# Patient Record
Sex: Male | Born: 1965 | Race: Black or African American | Hispanic: No | Marital: Single
Health system: Southern US, Community
[De-identification: ages and names within clinical notes are randomized; demographics above are authoritative.]

---

## 1997-11-03 ENCOUNTER — Emergency Department (HOSPITAL_COMMUNITY): Admission: EM | Admit: 1997-11-03 | Discharge: 1997-11-03 | Payer: Self-pay | Admitting: Emergency Medicine

## 1997-11-16 ENCOUNTER — Encounter: Admission: RE | Admit: 1997-11-16 | Discharge: 1997-11-16 | Payer: Self-pay | Admitting: Internal Medicine

## 1998-08-21 ENCOUNTER — Encounter: Payer: Self-pay | Admitting: Emergency Medicine

## 1998-08-21 ENCOUNTER — Inpatient Hospital Stay (HOSPITAL_COMMUNITY): Admission: EM | Admit: 1998-08-21 | Discharge: 1998-08-22 | Payer: Self-pay | Admitting: Emergency Medicine

## 1998-08-24 ENCOUNTER — Encounter: Admission: RE | Admit: 1998-08-24 | Discharge: 1998-08-24 | Payer: Self-pay | Admitting: Internal Medicine

## 1998-09-07 ENCOUNTER — Encounter: Admission: RE | Admit: 1998-09-07 | Discharge: 1998-09-07 | Payer: Self-pay | Admitting: Internal Medicine

## 1998-09-07 ENCOUNTER — Ambulatory Visit (HOSPITAL_COMMUNITY): Admission: RE | Admit: 1998-09-07 | Discharge: 1998-09-07 | Payer: Self-pay | Admitting: Internal Medicine

## 1998-09-16 ENCOUNTER — Encounter: Payer: Self-pay | Admitting: *Deleted

## 1998-09-16 ENCOUNTER — Emergency Department (HOSPITAL_COMMUNITY): Admission: EM | Admit: 1998-09-16 | Discharge: 1998-09-16 | Payer: Self-pay | Admitting: Emergency Medicine

## 1998-10-28 ENCOUNTER — Encounter: Admission: RE | Admit: 1998-10-28 | Discharge: 1998-10-28 | Payer: Self-pay | Admitting: Internal Medicine

## 1999-05-26 ENCOUNTER — Emergency Department (HOSPITAL_COMMUNITY): Admission: EM | Admit: 1999-05-26 | Discharge: 1999-05-26 | Payer: Self-pay | Admitting: Emergency Medicine

## 2000-10-23 ENCOUNTER — Encounter: Admission: RE | Admit: 2000-10-23 | Discharge: 2000-10-23 | Payer: Self-pay | Admitting: Internal Medicine

## 2000-10-23 ENCOUNTER — Ambulatory Visit (HOSPITAL_COMMUNITY): Admission: RE | Admit: 2000-10-23 | Discharge: 2000-10-23 | Payer: Self-pay | Admitting: Internal Medicine

## 2000-10-30 ENCOUNTER — Encounter: Admission: RE | Admit: 2000-10-30 | Discharge: 2000-10-30 | Payer: Self-pay | Admitting: Internal Medicine

## 2000-11-30 ENCOUNTER — Encounter: Admission: RE | Admit: 2000-11-30 | Discharge: 2000-11-30 | Payer: Self-pay | Admitting: Internal Medicine

## 2001-06-23 ENCOUNTER — Emergency Department (HOSPITAL_COMMUNITY): Admission: EM | Admit: 2001-06-23 | Discharge: 2001-06-23 | Payer: Self-pay | Admitting: Emergency Medicine

## 2001-11-19 ENCOUNTER — Emergency Department (HOSPITAL_COMMUNITY): Admission: EM | Admit: 2001-11-19 | Discharge: 2001-11-20 | Payer: Self-pay | Admitting: Emergency Medicine

## 2002-02-07 ENCOUNTER — Encounter: Admission: RE | Admit: 2002-02-07 | Discharge: 2002-02-07 | Payer: Self-pay | Admitting: Internal Medicine

## 2002-02-07 ENCOUNTER — Ambulatory Visit (HOSPITAL_COMMUNITY): Admission: RE | Admit: 2002-02-07 | Discharge: 2002-02-07 | Payer: Self-pay | Admitting: Internal Medicine

## 2002-05-15 ENCOUNTER — Encounter: Admission: RE | Admit: 2002-05-15 | Discharge: 2002-05-15 | Payer: Self-pay | Admitting: Internal Medicine

## 2002-06-25 ENCOUNTER — Encounter: Admission: RE | Admit: 2002-06-25 | Discharge: 2002-06-25 | Payer: Self-pay | Admitting: Internal Medicine

## 2003-07-04 ENCOUNTER — Emergency Department (HOSPITAL_COMMUNITY): Admission: EM | Admit: 2003-07-04 | Discharge: 2003-07-04 | Payer: Self-pay | Admitting: Emergency Medicine

## 2004-02-03 ENCOUNTER — Emergency Department (HOSPITAL_COMMUNITY): Admission: EM | Admit: 2004-02-03 | Discharge: 2004-02-03 | Payer: Self-pay | Admitting: Emergency Medicine

## 2004-04-28 ENCOUNTER — Ambulatory Visit (HOSPITAL_COMMUNITY): Admission: RE | Admit: 2004-04-28 | Discharge: 2004-04-28 | Payer: Self-pay | Admitting: Internal Medicine

## 2004-04-28 ENCOUNTER — Ambulatory Visit: Payer: Self-pay | Admitting: Internal Medicine

## 2004-06-06 ENCOUNTER — Ambulatory Visit: Payer: Self-pay | Admitting: Internal Medicine

## 2004-06-07 ENCOUNTER — Ambulatory Visit: Payer: Self-pay | Admitting: Internal Medicine

## 2004-08-21 ENCOUNTER — Emergency Department (HOSPITAL_COMMUNITY): Admission: EM | Admit: 2004-08-21 | Discharge: 2004-08-21 | Payer: Self-pay | Admitting: Emergency Medicine

## 2005-04-05 ENCOUNTER — Ambulatory Visit: Payer: Self-pay | Admitting: Internal Medicine

## 2005-04-26 ENCOUNTER — Emergency Department (HOSPITAL_COMMUNITY): Admission: EM | Admit: 2005-04-26 | Discharge: 2005-04-26 | Payer: Self-pay | Admitting: Emergency Medicine

## 2005-06-14 ENCOUNTER — Ambulatory Visit: Payer: Self-pay | Admitting: Internal Medicine

## 2005-08-23 ENCOUNTER — Ambulatory Visit: Payer: Self-pay | Admitting: Internal Medicine

## 2005-08-23 ENCOUNTER — Emergency Department (HOSPITAL_COMMUNITY): Admission: EM | Admit: 2005-08-23 | Discharge: 2005-08-23 | Payer: Self-pay | Admitting: Emergency Medicine

## 2005-12-30 ENCOUNTER — Emergency Department (HOSPITAL_COMMUNITY): Admission: EM | Admit: 2005-12-30 | Discharge: 2005-12-30 | Payer: Self-pay | Admitting: Emergency Medicine

## 2006-03-03 DIAGNOSIS — A539 Syphilis, unspecified: Secondary | ICD-10-CM

## 2006-03-03 DIAGNOSIS — F17200 Nicotine dependence, unspecified, uncomplicated: Secondary | ICD-10-CM

## 2006-03-03 DIAGNOSIS — B20 Human immunodeficiency virus [HIV] disease: Secondary | ICD-10-CM

## 2006-05-14 DIAGNOSIS — J309 Allergic rhinitis, unspecified: Secondary | ICD-10-CM | POA: Insufficient documentation

## 2007-04-29 ENCOUNTER — Encounter (INDEPENDENT_AMBULATORY_CARE_PROVIDER_SITE_OTHER): Payer: Self-pay | Admitting: *Deleted

## 2007-04-29 ENCOUNTER — Ambulatory Visit: Payer: Self-pay | Admitting: Internal Medicine

## 2007-04-29 ENCOUNTER — Encounter: Admission: RE | Admit: 2007-04-29 | Discharge: 2007-04-29 | Payer: Self-pay | Admitting: Internal Medicine

## 2007-04-29 ENCOUNTER — Ambulatory Visit (HOSPITAL_COMMUNITY): Admission: RE | Admit: 2007-04-29 | Discharge: 2007-04-29 | Payer: Self-pay | Admitting: Internal Medicine

## 2007-04-29 LAB — CONVERTED CEMR LAB
Bilirubin Urine: NEGATIVE
Blood in Urine, dipstick: NEGATIVE
Ketones, urine, test strip: NEGATIVE
Specific Gravity, Urine: 1.01
pH: 7.5

## 2007-04-30 ENCOUNTER — Encounter (INDEPENDENT_AMBULATORY_CARE_PROVIDER_SITE_OTHER): Payer: Self-pay | Admitting: *Deleted

## 2007-04-30 LAB — CONVERTED CEMR LAB
AST: 29 units/L (ref 0–37)
Albumin: 4.2 g/dL (ref 3.5–5.2)
BUN: 15 mg/dL (ref 6–23)
Barbiturate Quant, Ur: NEGATIVE
Basophils Relative: 1 % (ref 0–1)
CO2: 27 meq/L (ref 19–32)
Calcium: 9.2 mg/dL (ref 8.4–10.5)
Chloride: 103 meq/L (ref 96–112)
Cholesterol: 121 mg/dL (ref 0–200)
Creatinine,U: 128.3 mg/dL
HCV Ab: NEGATIVE
HDL: 27 mg/dL — ABNORMAL LOW (ref >39)
Hemoglobin: 14.8 g/dL (ref 13.0–17.0)
Hep B C IgM: NEGATIVE
Hepatitis B Surface Ag: NEGATIVE
Lymphocytes Relative: 27 % (ref 12–46)
Lymphs Abs: 1.5 10*3/uL (ref 0.7–4.0)
MCHC: 31.3 g/dL (ref 30.0–36.0)
Monocytes Absolute: 0.6 10*3/uL (ref 0.1–1.0)
Monocytes Relative: 10 % (ref 3–12)
Neutro Abs: 3.5 10*3/uL (ref 1.7–7.7)
Neutrophils Relative %: 62 % (ref 43–77)
Opiates: NEGATIVE
Phencyclidine (PCP): NEGATIVE
Potassium: 4.6 meq/L (ref 3.5–5.3)
Propoxyphene: NEGATIVE
RBC: 5.22 M/uL (ref 4.22–5.81)
RPR Ser Ql: REACTIVE — AB
RPR Titer: 1:128 {titer} — AB
WBC: 5.7 10*3/uL (ref 4.0–10.5)

## 2007-05-02 ENCOUNTER — Inpatient Hospital Stay (HOSPITAL_COMMUNITY): Admission: AD | Admit: 2007-05-02 | Discharge: 2007-05-04 | Payer: Self-pay | Admitting: Hospitalist

## 2007-05-02 ENCOUNTER — Ambulatory Visit: Payer: Self-pay | Admitting: Internal Medicine

## 2007-05-02 ENCOUNTER — Encounter: Admission: RE | Admit: 2007-05-02 | Discharge: 2007-05-02 | Payer: Self-pay | Admitting: Infectious Disease

## 2007-05-02 ENCOUNTER — Ambulatory Visit: Payer: Self-pay | Admitting: Hospitalist

## 2007-05-02 DIAGNOSIS — R519 Headache, unspecified: Secondary | ICD-10-CM | POA: Insufficient documentation

## 2007-05-02 DIAGNOSIS — R51 Headache: Secondary | ICD-10-CM | POA: Insufficient documentation

## 2007-05-02 DIAGNOSIS — R05 Cough: Secondary | ICD-10-CM

## 2007-05-13 ENCOUNTER — Encounter (INDEPENDENT_AMBULATORY_CARE_PROVIDER_SITE_OTHER): Payer: Self-pay | Admitting: Hospitalist

## 2007-05-14 ENCOUNTER — Ambulatory Visit: Payer: Self-pay | Admitting: Hospitalist

## 2007-05-14 DIAGNOSIS — J019 Acute sinusitis, unspecified: Secondary | ICD-10-CM | POA: Insufficient documentation

## 2007-06-14 ENCOUNTER — Ambulatory Visit: Payer: Self-pay | Admitting: Internal Medicine

## 2007-06-14 ENCOUNTER — Encounter (INDEPENDENT_AMBULATORY_CARE_PROVIDER_SITE_OTHER): Payer: Self-pay | Admitting: *Deleted

## 2007-07-17 ENCOUNTER — Ambulatory Visit: Payer: Self-pay | Admitting: Internal Medicine

## 2007-07-18 ENCOUNTER — Encounter (INDEPENDENT_AMBULATORY_CARE_PROVIDER_SITE_OTHER): Payer: Self-pay | Admitting: *Deleted

## 2007-10-10 ENCOUNTER — Telehealth (INDEPENDENT_AMBULATORY_CARE_PROVIDER_SITE_OTHER): Payer: Self-pay | Admitting: *Deleted

## 2007-10-14 ENCOUNTER — Telehealth (INDEPENDENT_AMBULATORY_CARE_PROVIDER_SITE_OTHER): Payer: Self-pay | Admitting: *Deleted

## 2007-11-05 ENCOUNTER — Telehealth (INDEPENDENT_AMBULATORY_CARE_PROVIDER_SITE_OTHER): Payer: Self-pay | Admitting: *Deleted

## 2007-12-13 ENCOUNTER — Encounter: Payer: Self-pay | Admitting: Internal Medicine

## 2007-12-23 ENCOUNTER — Telehealth: Payer: Self-pay | Admitting: Internal Medicine

## 2008-01-02 ENCOUNTER — Ambulatory Visit: Payer: Self-pay | Admitting: Internal Medicine

## 2008-01-02 ENCOUNTER — Encounter (INDEPENDENT_AMBULATORY_CARE_PROVIDER_SITE_OTHER): Payer: Self-pay | Admitting: *Deleted

## 2008-01-27 ENCOUNTER — Ambulatory Visit: Payer: Self-pay | Admitting: Internal Medicine

## 2008-01-27 ENCOUNTER — Encounter: Payer: Self-pay | Admitting: Internal Medicine

## 2008-01-27 DIAGNOSIS — J069 Acute upper respiratory infection, unspecified: Secondary | ICD-10-CM | POA: Insufficient documentation

## 2008-01-27 LAB — CONVERTED CEMR LAB
AST: 31 units/L (ref 0–37)
Alkaline Phosphatase: 103 units/L (ref 39–117)
BUN: 12 mg/dL (ref 6–23)
Basophils Relative: 0 % (ref 0–1)
Calcium: 9.3 mg/dL (ref 8.4–10.5)
Creatinine, Ser: 0.78 mg/dL (ref 0.40–1.50)
Eosinophils Absolute: 0.1 10*3/uL (ref 0.0–0.7)
Eosinophils Relative: 1 % (ref 0–5)
HDL: 36 mg/dL — ABNORMAL LOW (ref >39)
Hemoglobin: 15.6 g/dL (ref 13.0–17.0)
Hepatitis B Surface Ag: NEGATIVE
Ketones, ur: NEGATIVE mg/dL
Leukocytes, UA: NEGATIVE
MCHC: 32 g/dL (ref 30.0–36.0)
MCV: 87.4 fL (ref 78.0–100.0)
Monocytes Absolute: 0.6 10*3/uL (ref 0.1–1.0)
Monocytes Relative: 9 % (ref 3–12)
Nitrite: NEGATIVE
RBC: 5.57 M/uL (ref 4.22–5.81)
RPR Titer: 1:2 {titer}
Specific Gravity, Urine: 1.023 (ref 1.005–1.03)
Total CHOL/HDL Ratio: 3.6
pH: 6 (ref 5.0–8.0)

## 2008-02-05 ENCOUNTER — Telehealth (INDEPENDENT_AMBULATORY_CARE_PROVIDER_SITE_OTHER): Payer: Self-pay | Admitting: *Deleted

## 2008-02-12 ENCOUNTER — Ambulatory Visit: Payer: Self-pay | Admitting: Internal Medicine

## 2008-02-26 ENCOUNTER — Encounter (INDEPENDENT_AMBULATORY_CARE_PROVIDER_SITE_OTHER): Payer: Self-pay | Admitting: *Deleted

## 2008-02-26 ENCOUNTER — Ambulatory Visit: Payer: Self-pay | Admitting: Internal Medicine

## 2008-02-26 DIAGNOSIS — H00019 Hordeolum externum unspecified eye, unspecified eyelid: Secondary | ICD-10-CM | POA: Insufficient documentation

## 2008-02-26 LAB — CONVERTED CEMR LAB
HIV 1 RNA Quant: 76 copies/mL — ABNORMAL HIGH (ref <48)
HIV-1 RNA Quant, Log: 1.88 — ABNORMAL HIGH (ref <1.68)

## 2008-02-27 ENCOUNTER — Telehealth: Payer: Self-pay | Admitting: Internal Medicine

## 2008-02-28 ENCOUNTER — Telehealth (INDEPENDENT_AMBULATORY_CARE_PROVIDER_SITE_OTHER): Payer: Self-pay | Admitting: *Deleted

## 2008-03-23 ENCOUNTER — Telehealth: Payer: Self-pay | Admitting: Internal Medicine

## 2008-03-27 ENCOUNTER — Telehealth (INDEPENDENT_AMBULATORY_CARE_PROVIDER_SITE_OTHER): Payer: Self-pay | Admitting: *Deleted

## 2008-04-03 ENCOUNTER — Telehealth (INDEPENDENT_AMBULATORY_CARE_PROVIDER_SITE_OTHER): Payer: Self-pay | Admitting: *Deleted

## 2008-04-14 ENCOUNTER — Telehealth: Payer: Self-pay | Admitting: Licensed Clinical Social Worker

## 2008-04-21 ENCOUNTER — Telehealth: Payer: Self-pay | Admitting: Internal Medicine

## 2008-04-27 ENCOUNTER — Emergency Department (HOSPITAL_COMMUNITY): Admission: EM | Admit: 2008-04-27 | Discharge: 2008-04-27 | Payer: Self-pay | Admitting: Emergency Medicine

## 2008-04-29 ENCOUNTER — Telehealth: Payer: Self-pay | Admitting: Internal Medicine

## 2008-05-06 ENCOUNTER — Telehealth (INDEPENDENT_AMBULATORY_CARE_PROVIDER_SITE_OTHER): Payer: Self-pay | Admitting: *Deleted

## 2008-05-19 ENCOUNTER — Encounter (INDEPENDENT_AMBULATORY_CARE_PROVIDER_SITE_OTHER): Payer: Self-pay | Admitting: Licensed Clinical Social Worker

## 2008-05-20 ENCOUNTER — Encounter (INDEPENDENT_AMBULATORY_CARE_PROVIDER_SITE_OTHER): Payer: Self-pay | Admitting: *Deleted

## 2008-06-02 ENCOUNTER — Telehealth (INDEPENDENT_AMBULATORY_CARE_PROVIDER_SITE_OTHER): Payer: Self-pay | Admitting: *Deleted

## 2008-06-15 ENCOUNTER — Ambulatory Visit: Payer: Self-pay | Admitting: Infectious Disease

## 2008-06-15 ENCOUNTER — Ambulatory Visit: Payer: Self-pay | Admitting: Internal Medicine

## 2008-06-15 ENCOUNTER — Inpatient Hospital Stay (HOSPITAL_COMMUNITY): Admission: AD | Admit: 2008-06-15 | Discharge: 2008-06-17 | Payer: Self-pay | Admitting: Infectious Disease

## 2008-06-15 DIAGNOSIS — R1110 Vomiting, unspecified: Secondary | ICD-10-CM

## 2008-06-16 LAB — CONVERTED CEMR LAB
CD4 Count: 580 microliters
WBC: 6.2 10*3/uL

## 2008-06-19 ENCOUNTER — Encounter: Payer: Self-pay | Admitting: Internal Medicine

## 2008-06-19 DIAGNOSIS — J189 Pneumonia, unspecified organism: Secondary | ICD-10-CM

## 2008-07-01 ENCOUNTER — Telehealth (INDEPENDENT_AMBULATORY_CARE_PROVIDER_SITE_OTHER): Payer: Self-pay | Admitting: *Deleted

## 2008-07-02 ENCOUNTER — Ambulatory Visit: Payer: Self-pay | Admitting: Internal Medicine

## 2008-07-15 ENCOUNTER — Ambulatory Visit: Payer: Self-pay | Admitting: Internal Medicine

## 2008-07-30 ENCOUNTER — Telehealth (INDEPENDENT_AMBULATORY_CARE_PROVIDER_SITE_OTHER): Payer: Self-pay | Admitting: *Deleted

## 2008-08-26 ENCOUNTER — Telehealth (INDEPENDENT_AMBULATORY_CARE_PROVIDER_SITE_OTHER): Payer: Self-pay | Admitting: *Deleted

## 2008-09-28 ENCOUNTER — Telehealth (INDEPENDENT_AMBULATORY_CARE_PROVIDER_SITE_OTHER): Payer: Self-pay | Admitting: *Deleted

## 2008-10-14 ENCOUNTER — Ambulatory Visit: Payer: Self-pay | Admitting: Internal Medicine

## 2008-10-14 LAB — CONVERTED CEMR LAB
Albumin: 4.2 g/dL (ref 3.5–5.2)
BUN: 19 mg/dL (ref 6–23)
Basophils Absolute: 0 10*3/uL (ref 0.0–0.1)
CO2: 23 meq/L (ref 19–32)
Calcium: 9 mg/dL (ref 8.4–10.5)
Chloride: 107 meq/L (ref 96–112)
GFR calc Af Amer: >60 mL/min (ref >60)
GFR calc non Af Amer: >60 mL/min (ref >60)
Glucose, Bld: 91 mg/dL (ref 70–99)
HIV-1 RNA Quant, Log: <1.68 (ref <1.68)
Lymphocytes Relative: 20 % (ref 12–46)
Lymphs Abs: 1.6 10*3/uL (ref 0.7–4.0)
Neutrophils Relative %: 71 % (ref 43–77)
Platelets: 206 10*3/uL (ref 150–400)
Potassium: 4.2 meq/L (ref 3.5–5.3)
RDW: 14 % (ref 11.5–15.5)
WBC: 8.2 10*3/uL (ref 4.0–10.5)

## 2008-10-22 ENCOUNTER — Telehealth (INDEPENDENT_AMBULATORY_CARE_PROVIDER_SITE_OTHER): Payer: Self-pay | Admitting: *Deleted

## 2008-11-17 ENCOUNTER — Telehealth (INDEPENDENT_AMBULATORY_CARE_PROVIDER_SITE_OTHER): Payer: Self-pay | Admitting: *Deleted

## 2008-12-15 ENCOUNTER — Telehealth (INDEPENDENT_AMBULATORY_CARE_PROVIDER_SITE_OTHER): Payer: Self-pay | Admitting: *Deleted

## 2009-01-13 ENCOUNTER — Ambulatory Visit: Payer: Self-pay | Admitting: Internal Medicine

## 2009-01-13 ENCOUNTER — Telehealth (INDEPENDENT_AMBULATORY_CARE_PROVIDER_SITE_OTHER): Payer: Self-pay | Admitting: *Deleted

## 2009-01-13 DIAGNOSIS — K029 Dental caries, unspecified: Secondary | ICD-10-CM | POA: Insufficient documentation

## 2009-01-13 LAB — CONVERTED CEMR LAB
ALT: 27 units/L (ref 0–53)
AST: 25 units/L (ref 0–37)
Alkaline Phosphatase: 119 units/L — ABNORMAL HIGH (ref 39–117)
BUN: 20 mg/dL (ref 6–23)
Basophils Absolute: 0 10*3/uL (ref 0.0–0.1)
Calcium: 9.2 mg/dL (ref 8.4–10.5)
Chloride: 102 meq/L (ref 96–112)
Creatinine, Ser: 0.97 mg/dL (ref 0.40–1.50)
Eosinophils Absolute: 0.1 10*3/uL (ref 0.0–0.7)
Eosinophils Relative: 1 % (ref 0–5)
Lymphs Abs: 1.8 10*3/uL (ref 0.7–4.0)
MCV: 93.8 fL (ref >=78.0)
Neutrophils Relative %: 76 % (ref 43–77)
Platelets: 214 10*3/uL (ref 150–400)
RDW: 14.3 % (ref 11.5–15.5)
Total Bilirubin: 0.4 mg/dL (ref 0.3–1.2)
WBC: 10.7 10*3/uL — ABNORMAL HIGH (ref 4.0–10.5)

## 2009-02-10 ENCOUNTER — Telehealth (INDEPENDENT_AMBULATORY_CARE_PROVIDER_SITE_OTHER): Payer: Self-pay | Admitting: *Deleted

## 2009-03-10 ENCOUNTER — Observation Stay (HOSPITAL_COMMUNITY): Admission: EM | Admit: 2009-03-10 | Discharge: 2009-03-13 | Payer: Self-pay | Admitting: Emergency Medicine

## 2009-03-10 ENCOUNTER — Telehealth: Payer: Self-pay | Admitting: Internal Medicine

## 2009-03-10 ENCOUNTER — Ambulatory Visit: Payer: Self-pay | Admitting: Internal Medicine

## 2009-03-10 ENCOUNTER — Encounter (INDEPENDENT_AMBULATORY_CARE_PROVIDER_SITE_OTHER): Payer: Self-pay | Admitting: Internal Medicine

## 2009-03-12 ENCOUNTER — Telehealth (INDEPENDENT_AMBULATORY_CARE_PROVIDER_SITE_OTHER): Payer: Self-pay | Admitting: *Deleted

## 2009-04-06 ENCOUNTER — Telehealth (INDEPENDENT_AMBULATORY_CARE_PROVIDER_SITE_OTHER): Payer: Self-pay | Admitting: *Deleted

## 2009-04-27 ENCOUNTER — Telehealth (INDEPENDENT_AMBULATORY_CARE_PROVIDER_SITE_OTHER): Payer: Self-pay | Admitting: *Deleted

## 2009-04-28 ENCOUNTER — Ambulatory Visit: Payer: Self-pay | Admitting: Internal Medicine

## 2009-04-28 DIAGNOSIS — J209 Acute bronchitis, unspecified: Secondary | ICD-10-CM

## 2009-06-05 ENCOUNTER — Telehealth (INDEPENDENT_AMBULATORY_CARE_PROVIDER_SITE_OTHER): Payer: Self-pay | Admitting: *Deleted

## 2009-06-28 ENCOUNTER — Telehealth (INDEPENDENT_AMBULATORY_CARE_PROVIDER_SITE_OTHER): Payer: Self-pay | Admitting: *Deleted

## 2009-07-09 ENCOUNTER — Encounter (INDEPENDENT_AMBULATORY_CARE_PROVIDER_SITE_OTHER): Payer: Self-pay | Admitting: *Deleted

## 2009-08-02 ENCOUNTER — Encounter (INDEPENDENT_AMBULATORY_CARE_PROVIDER_SITE_OTHER): Payer: Self-pay | Admitting: *Deleted

## 2009-08-04 ENCOUNTER — Telehealth (INDEPENDENT_AMBULATORY_CARE_PROVIDER_SITE_OTHER): Payer: Self-pay | Admitting: *Deleted

## 2009-08-27 ENCOUNTER — Telehealth (INDEPENDENT_AMBULATORY_CARE_PROVIDER_SITE_OTHER): Payer: Self-pay | Admitting: *Deleted

## 2009-10-08 ENCOUNTER — Telehealth: Payer: Self-pay | Admitting: Internal Medicine

## 2009-11-19 ENCOUNTER — Telehealth: Payer: Self-pay | Admitting: Internal Medicine

## 2009-11-22 ENCOUNTER — Encounter: Payer: Self-pay | Admitting: Internal Medicine

## 2009-12-13 ENCOUNTER — Ambulatory Visit: Payer: Self-pay | Admitting: Internal Medicine

## 2009-12-13 ENCOUNTER — Other Ambulatory Visit: Payer: Self-pay | Admitting: Infectious Disease

## 2009-12-13 LAB — CONVERTED CEMR LAB
AST: 17 units/L (ref 0–37)
Albumin: 4.3 g/dL (ref 3.5–5.2)
Alkaline Phosphatase: 108 units/L (ref 39–117)
BUN: 16 mg/dL (ref 6–23)
Basophils Relative: 1 % (ref 0–1)
Creatinine, Ser: 0.86 mg/dL (ref 0.40–1.50)
Eosinophils Absolute: 0.1 10*3/uL (ref 0.0–0.7)
Eosinophils Relative: 2 % (ref 0–5)
Glucose, Bld: 158 mg/dL — ABNORMAL HIGH (ref 70–99)
HCT: 46.3 % (ref 39.0–52.0)
HIV 1 RNA Quant: <48 copies/mL (ref <48)
Hemoglobin: 14.8 g/dL (ref 13.0–17.0)
Lymphs Abs: 1.8 10*3/uL (ref 0.7–4.0)
MCHC: 32 g/dL (ref 30.0–36.0)
MCV: 93.9 fL (ref 78.0–100.0)
Monocytes Absolute: 0.5 10*3/uL (ref 0.1–1.0)
Monocytes Relative: 8 % (ref 3–12)
Potassium: 4.4 meq/L (ref 3.5–5.3)
RBC: 4.93 M/uL (ref 4.22–5.81)
RPR Titer: 1:1 {titer}
T pallidum Antibodies (TP-PA): >8 — ABNORMAL HIGH (ref <0.90)
WBC: 5.4 10*3/uL (ref 4.0–10.5)

## 2009-12-21 ENCOUNTER — Telehealth: Payer: Self-pay | Admitting: Internal Medicine

## 2010-01-17 ENCOUNTER — Telehealth: Payer: Self-pay | Admitting: Internal Medicine

## 2010-01-30 IMAGING — US US ABDOMEN COMPLETE
1 series · 13 of 25 positions shown · non-contrast
Comparison: CT examination 06/16/2008.

CLINICAL DATA: History of abdominal pain and history of fever.

ABDOMINAL ULTRASOUND COMPLETE

[Series 1: us abdomen complete · 0.30mm/px · 13 of 64 slices shown]
[im 1/64]
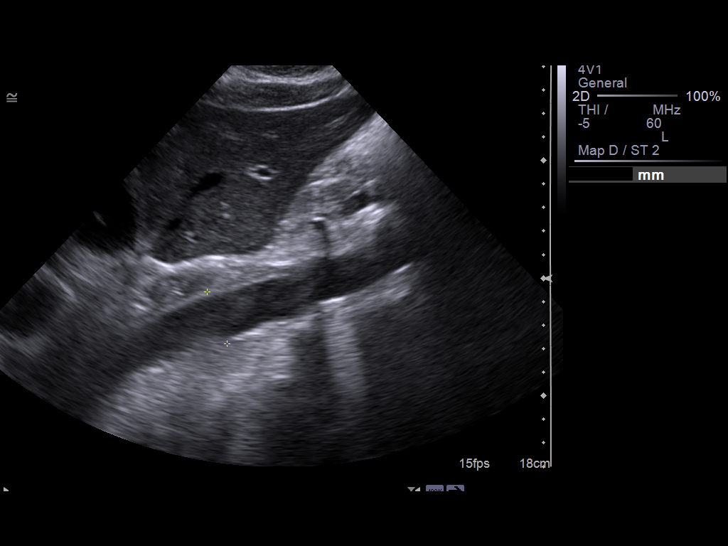
[im 6/64]
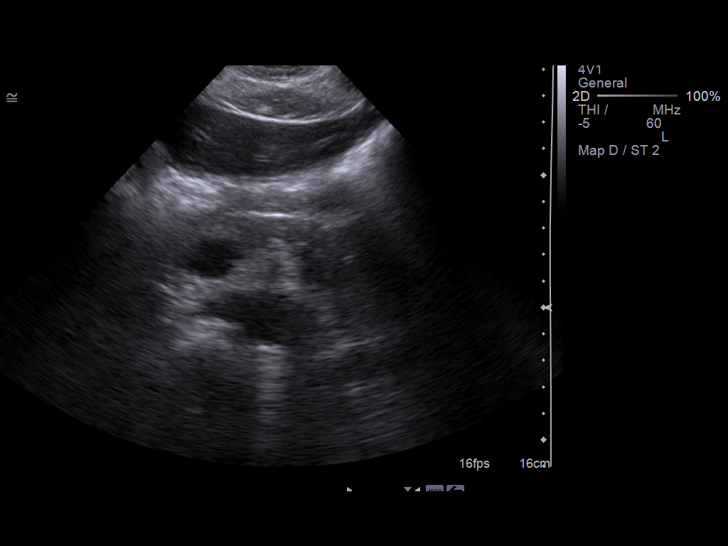
[im 11/64]
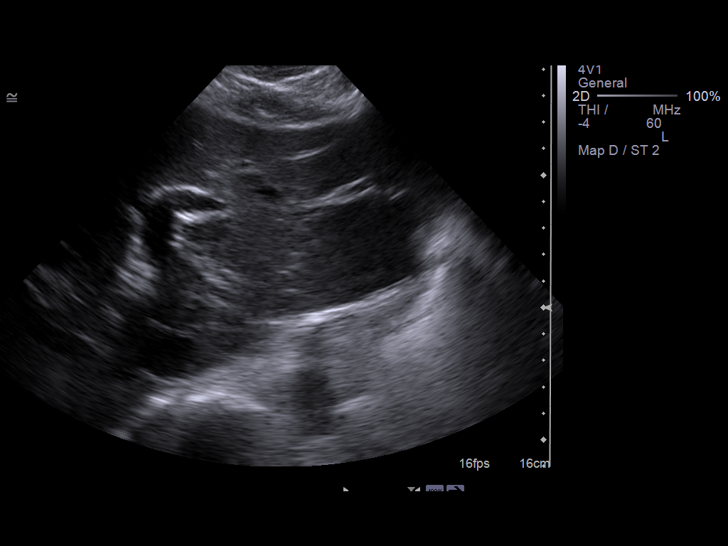
[im 16/64]
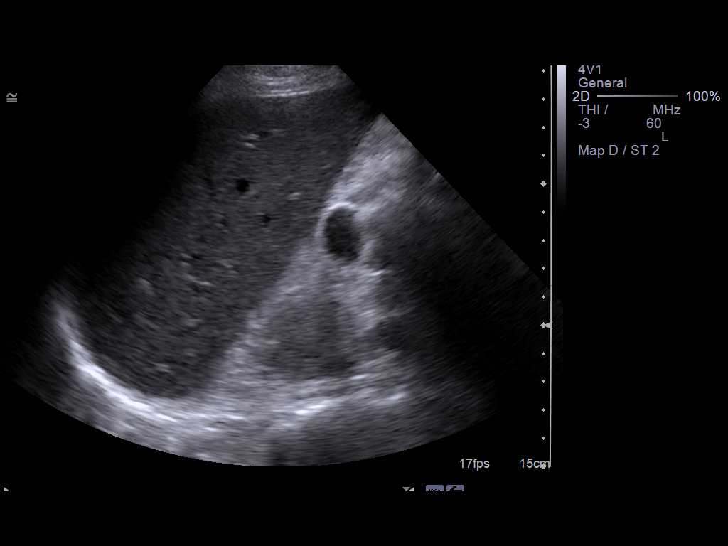
[im 22/64]
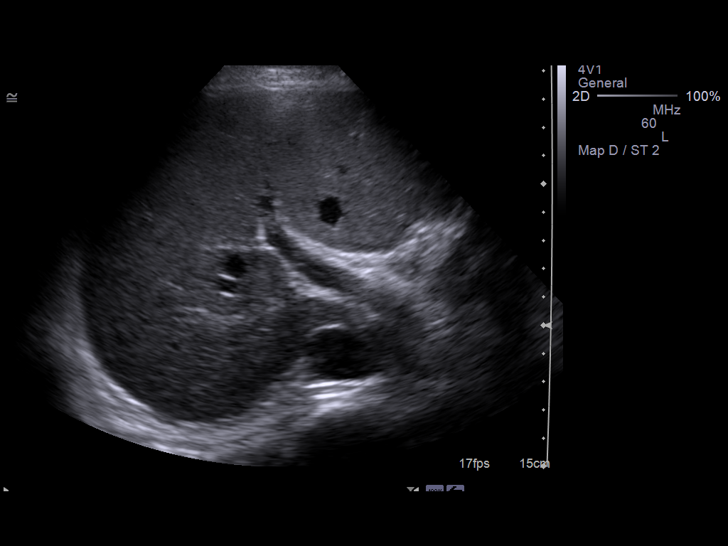
[im 27/64]
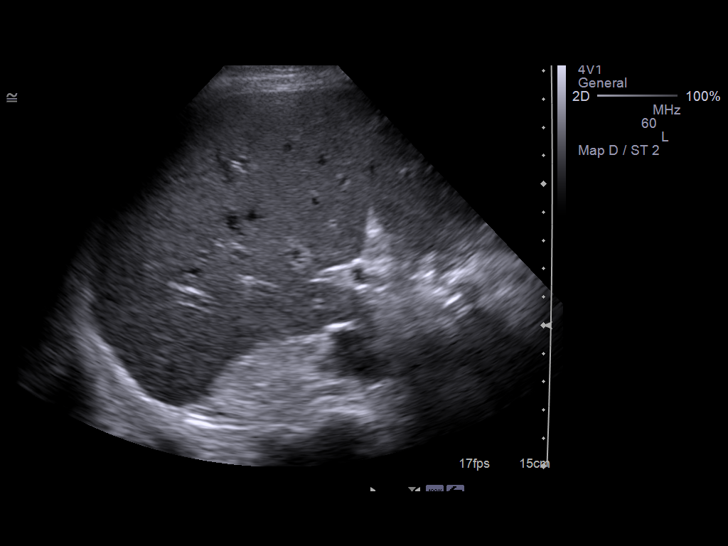
[im 32/64]
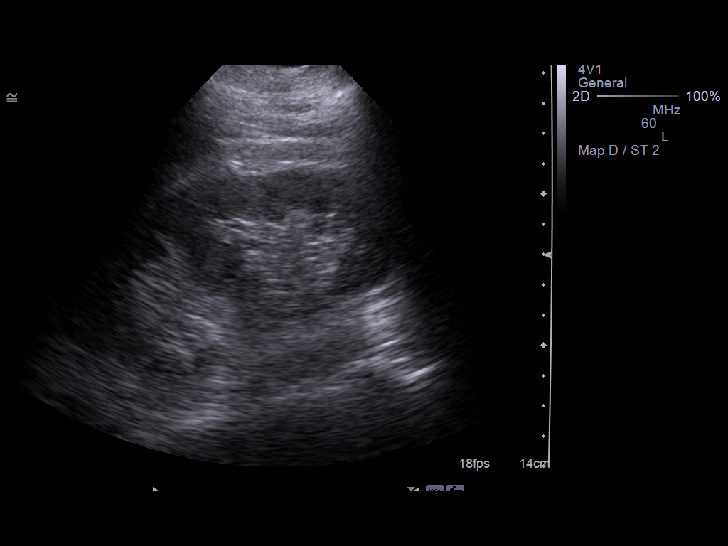
[im 37/64]
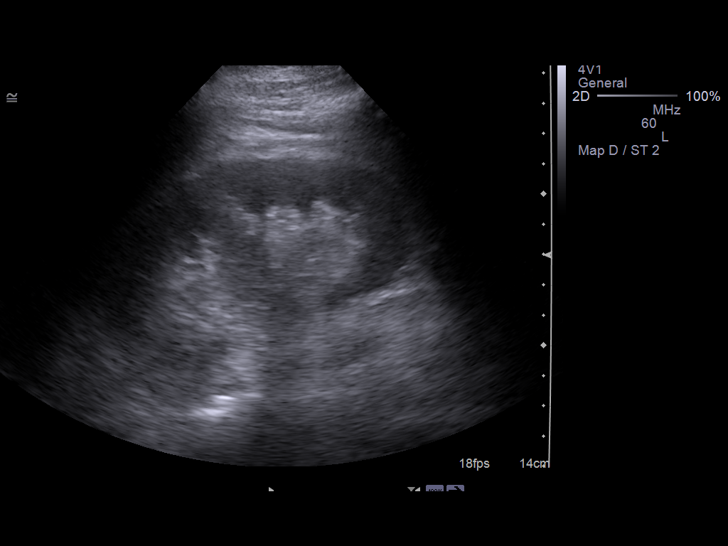
[im 43/64]
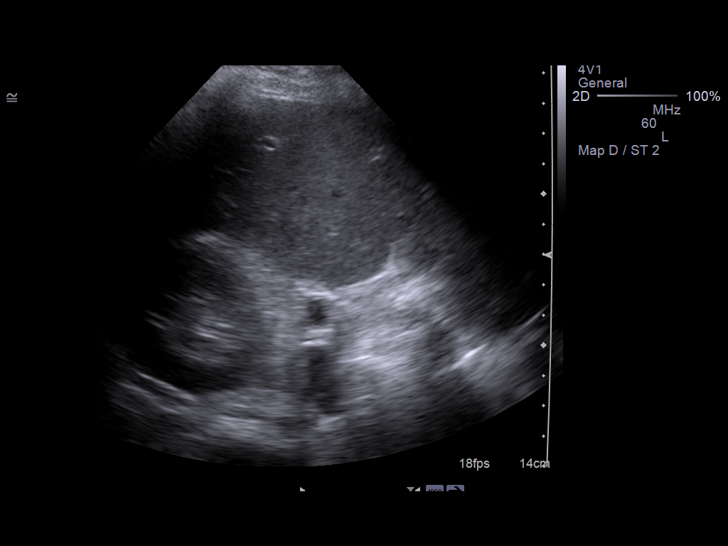
[im 48/64]
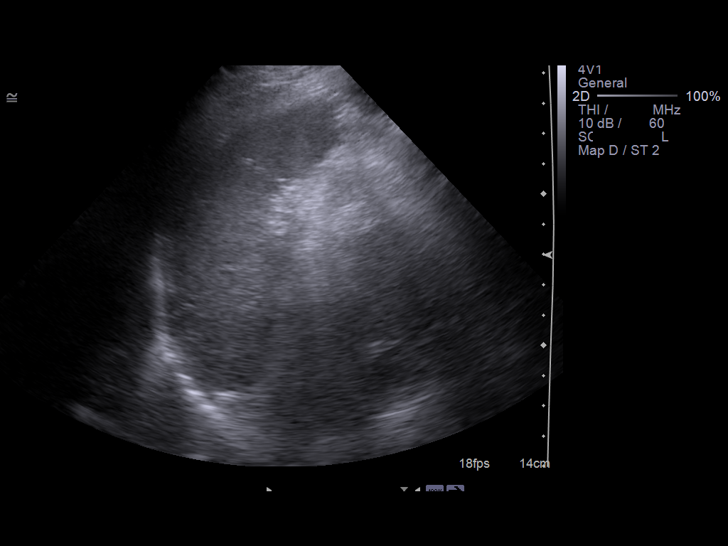
[im 53/64]
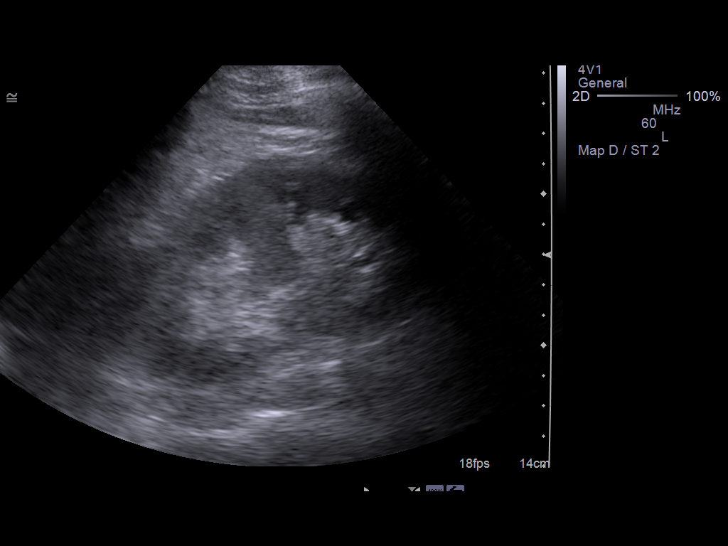
[im 58/64]
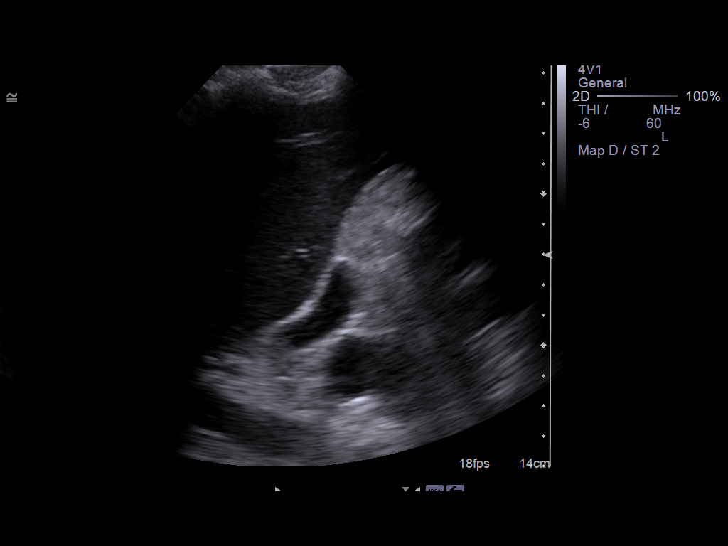
[im 64/64]
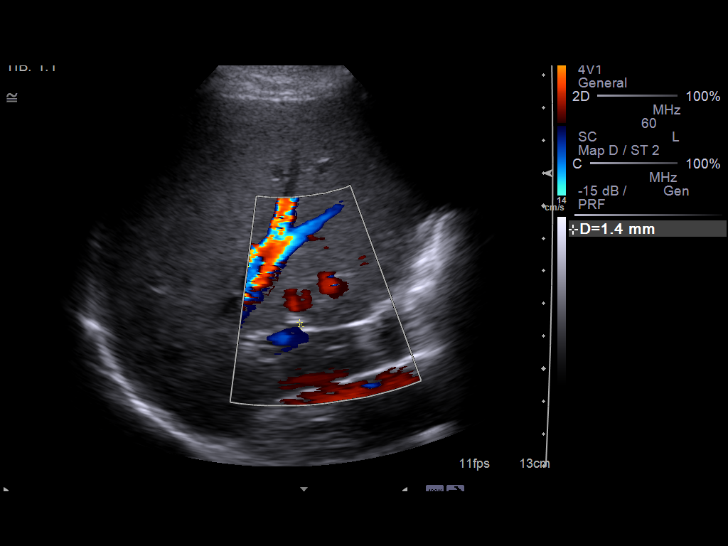

[13 of 25 positions shown; findings below may reference images not displayed]

FINDINGS: Gallbladder: No shadowing gallstones or echogenic sludge. No
gallbladder wall thickening or pericholecystic fluid. The
gallbladder wall thickness measured 2.9 mm. No sonographic Murphy's
sign according to the ultrasound technologist. The gall appears to
be small and contracted.  After question patient that gave
technologist information that the patient had not been n.p.o.

CBD: Normal in caliber measuring 1.4 mm. No choledocholithiasis is
evident.

Liver:  Normal size and echotexture without focal parenchymal
abnormality.

IVC:  Patent throughout its visualized course in the abdomen.

Pancreas:  Although the pancreas is difficult to visualize in its
entirety, no focal pancreatic abnormality is identified.

Spleen:  Normal size and echotexture without focal abnormality.
Length is 7.6 cm.

Right kidney:  No hydronephrosis.  Well-preserved cortex.  Normal
parenchymal echotexture without focal abnormalities.  Right renal
length is 11.0 cm.

Left kidney:  No hydronephrosis.  Well-preserved cortex.  Normal
parenchymal echotexture without focal abnormalities.  Left renal
length is 11.6 cm.

Aorta:  Maximum diameter is 2.4 cm.  No aneurysm is evident.

Ascites:  None.
IMPRESSION: No abdominal pathology was demonstrated.  The patient gave the
technologist information that the patient had not been fasting.
Gallbladder is small and partially contracted.  However no
cholelithiasis is evident.

## 2010-01-31 IMAGING — CR DG CHEST 1V PORT
1 series · 1 of 1 positions shown · non-contrast
Comparison: 03/10/2009

CLINICAL DATA: Follow up pneumonia

PORTABLE CHEST - 1 VIEW

[view not recorded]
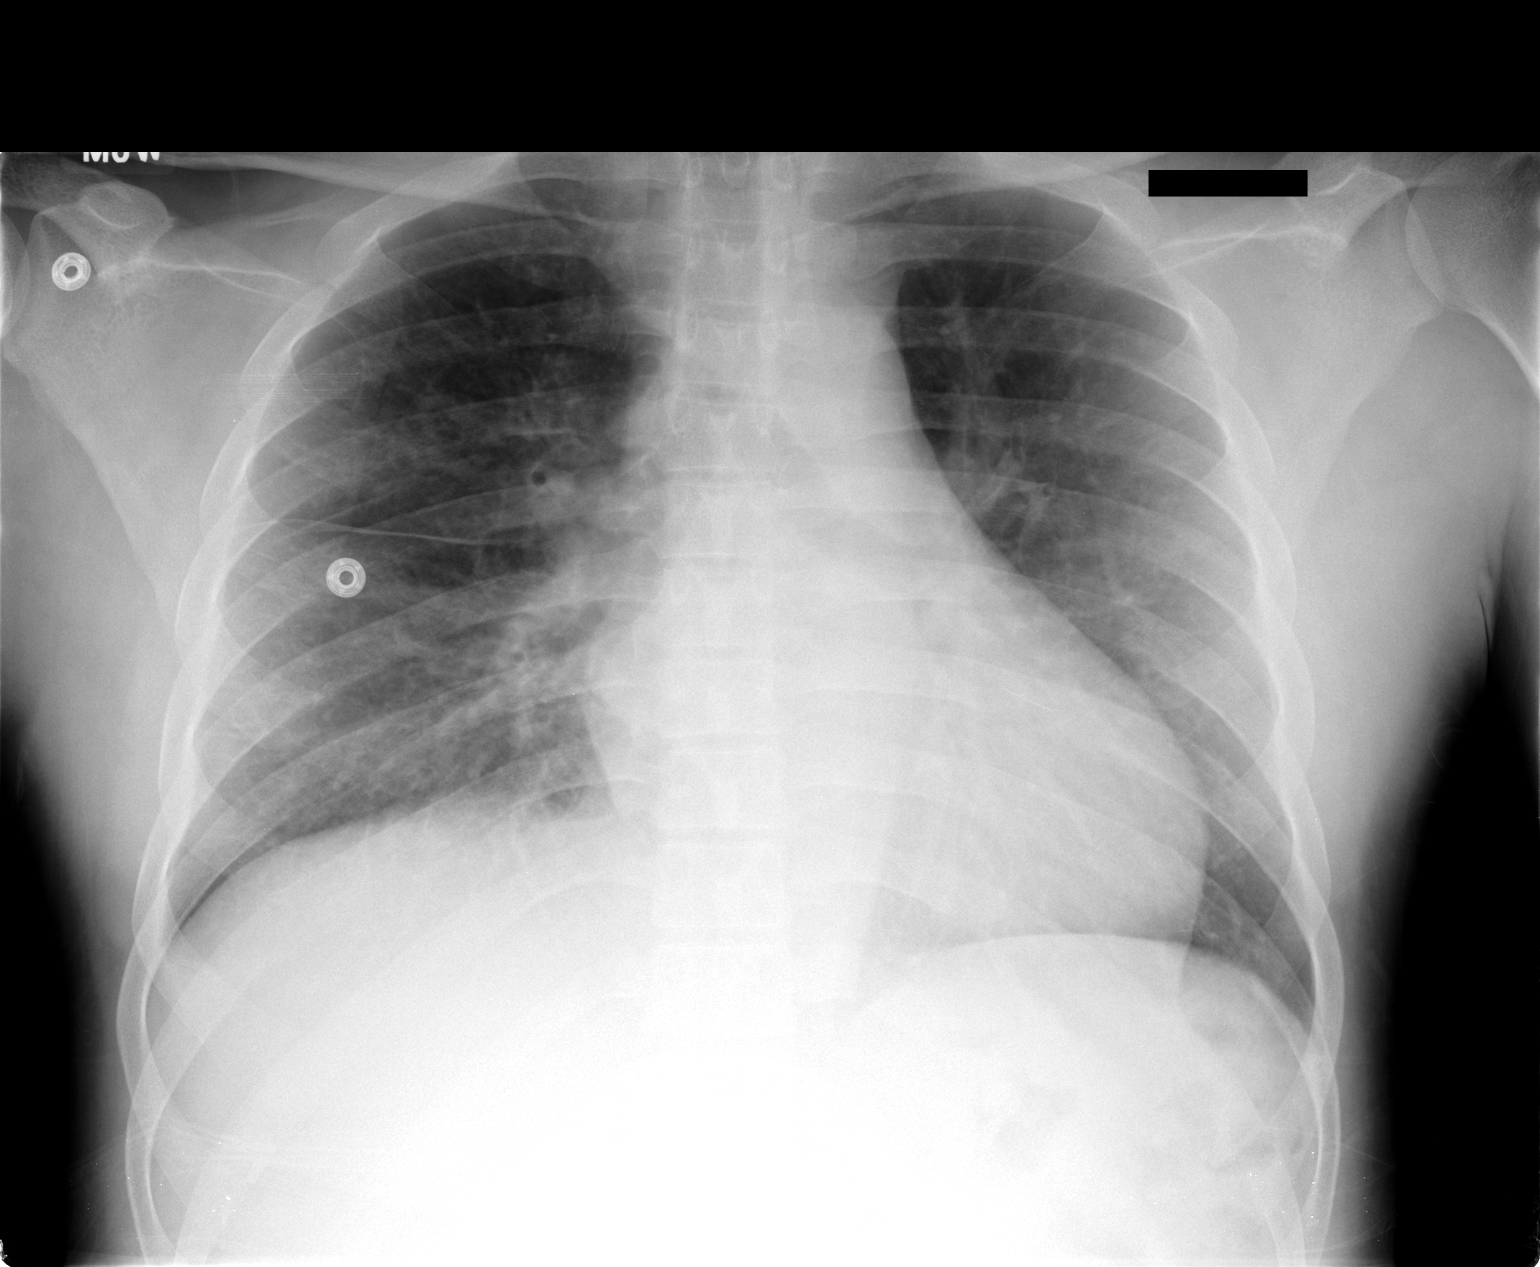

[1 of 1 positions shown; findings below may reference images not displayed]

FINDINGS: Peribronchial thickening may be due to bronchitic changes
or interstitial edema. No focal infiltrate/atelectasis.
Cardiomediastinal silhouette unremarkable.
IMPRESSION: Peribronchial thickening.  Query bronchitis versus mild
interstitial edema.

## 2010-02-15 ENCOUNTER — Telehealth (INDEPENDENT_AMBULATORY_CARE_PROVIDER_SITE_OTHER): Payer: Self-pay | Admitting: *Deleted

## 2010-04-19 ENCOUNTER — Encounter: Payer: Self-pay | Admitting: Internal Medicine

## 2010-05-06 ENCOUNTER — Ambulatory Visit: Admit: 2010-05-06 | Payer: Self-pay | Admitting: Internal Medicine

## 2010-05-18 ENCOUNTER — Ambulatory Visit: Admit: 2010-05-18 | Payer: Self-pay | Admitting: Internal Medicine

## 2010-05-18 ENCOUNTER — Ambulatory Visit
Admission: RE | Admit: 2010-05-18 | Discharge: 2010-05-18 | Payer: Self-pay | Source: Home / Self Care | Attending: Infectious Diseases | Admitting: Infectious Diseases

## 2010-05-18 ENCOUNTER — Encounter: Payer: Self-pay | Admitting: Infectious Diseases

## 2010-05-18 LAB — CONVERTED CEMR LAB
Albumin: 4.5 g/dL (ref 3.5–5.2)
BUN: 11 mg/dL (ref 6–23)
Basophils Absolute: 0 10*3/uL (ref 0.0–0.1)
Bilirubin Urine: NEGATIVE
Blood, UA: NEGATIVE
Calcium: 9.1 mg/dL (ref 8.4–10.5)
Chlamydia, Swab/Urine, PCR: NEGATIVE
Chloride: 106 meq/L (ref 96–112)
Creatinine, Ser: 1.07 mg/dL (ref 0.40–1.50)
GC Probe Amp, Urine: NEGATIVE
Glucose, Bld: 143 mg/dL — ABNORMAL HIGH (ref 70–99)
HCT: 48.3 % (ref 39.0–52.0)
HDL: 47 mg/dL (ref >39)
HIV 1 RNA Quant: <20 copies/mL (ref <20)
Hemoglobin: 15.6 g/dL (ref 13.0–17.0)
Hepatitis B Surface Ag: NEGATIVE
Ketones, ur: NEGATIVE mg/dL
LDL Cholesterol: 91 mg/dL (ref 0–99)
Lymphocytes Relative: 29 % (ref 12–46)
Lymphs Abs: 2.1 10*3/uL (ref 0.7–4.0)
Monocytes Absolute: 0.6 10*3/uL (ref 0.1–1.0)
Monocytes Relative: 9 % (ref 3–12)
Neutro Abs: 4.2 10*3/uL (ref 1.7–7.7)
Potassium: 4.3 meq/L (ref 3.5–5.3)
RBC: 5.23 M/uL (ref 4.22–5.81)
Specific Gravity, Urine: 1.02 (ref 1.005–1.030)
Total CHOL/HDL Ratio: 3.3
Urobilinogen, UA: 0.2 (ref 0.0–1.0)
VLDL: 17 mg/dL (ref 0–40)
WBC: 7 10*3/uL (ref 4.0–10.5)
pH: 5.5 (ref 5.0–8.0)

## 2010-05-19 LAB — T-HELPER CELL (CD4) - (RCID CLINIC ONLY)
CD4 % Helper T Cell: 27 % — ABNORMAL LOW (ref 33–55)
CD4 T Cell Abs: 590 uL (ref 400–2700)

## 2010-05-23 ENCOUNTER — Encounter: Payer: Self-pay | Admitting: Adult Health

## 2010-05-26 NOTE — Miscellaneous (Signed)
Summary: Orders Update  Clinical Lists Changes  Orders: Added new Test order of T-CBC w/Diff [phone]) - Signed Added new Test order of T-CD4SP [facility]) (CD4SP) - Signed Added new Test order of T-Comprehensive Metabolic Panel [phone]) - Signed Added new Test order of T-HIV Viral Load [phone]) - Signed     Process Orders Check Orders Results:     [facility]: ABN not required for this insurance Tests Sent for requisitioning (November 22, 2009 9:25 AM):     11/22/2009: [facility] -- T-CBC w/Diff [[id number]] (signed)     11/22/2009: [facility] -- T-Comprehensive Metabolic Panel [[id number]] (signed)     11/22/2009: [facility] -- T-HIV Viral Load [phone] (signed)

## 2010-05-26 NOTE — Progress Notes (Signed)
Summary: Patient picked up medication  Phone Note Other Incoming   Caller: Patient Summary of Call: Patient came in to pick up meds.

## 2010-05-26 NOTE — Progress Notes (Signed)
Summary: [facility]/pt assist med arrived for Feb  Phone Note Refill Request      Prescriptions: ATRIPLA 600-200-300 MG  TABS (EFAVIRENZ-EMTRICITAB-TENOFOVIR) Take 1 tablet by mouth at bedtime  #30 x 0   Entered by:   Paulo Fruit  BS,CPht II,MPH   Authorized by:   Yisroel Ramming MD   Signed by:   Paulo Fruit  BS,CPht II,MPH on 06/05/2009   Method used:   Samples Given   RxID:   [id number]   Patient Assist Medication Verification: Medication: Atripla Lot# [id number] Exp Date:06 2012 Tech approval:MLD Will call pt on Monday 06/07/09 Paulo Fruit  BS,CPht II,MPH  June 05, 2009 10:59 AM

## 2010-05-26 NOTE — Miscellaneous (Signed)
Summary: clinical update/ryan white [employer] apprv til 07/23/10  Clinical Lists Changes  Observations: Added new observation of AIDSDAP: Yes 2011 (08/02/2009 11:41)
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
## 2010-05-26 NOTE — Miscellaneous (Signed)
Summary: Orders Update - RPR orders added  Clinical Lists Changes  Orders: Added new Test order of T-RPR (Syphilis) [phone]) - Signed     Process Orders Check Orders Results:     [facility]: ABN not required for this insurance Order queued for requisitioning for [facility]: November 22, 2009 2:27 PM  Tests Sent for requisitioning (November 22, 2009 2:27 PM):     11/22/2009: [facility] -- T-RPR (Syphilis) [phone] (signed)

## 2010-05-26 NOTE — Progress Notes (Signed)
Summary: ncadap med arrived for Aug-- msg left on vm for pt  Phone Note Refill Request      Prescriptions: ATRIPLA 600-200-300 MG  TABS (EFAVIRENZ-EMTRICITAB-TENOFOVIR) Take 1 tablet by mouth at bedtime  #30 x 0   Entered by:   Paulo Fruit  BS,CPht II,MPH   Authorized by:   Yisroel Ramming MD   Signed by:   Paulo Fruit  BS,CPht II,MPH on 12/21/2009   Method used:   Samples Given   RxID:   [id number]  Patient Assist Medication Verification: Medication name: Atripla RX # [id number] Tech approval:MLD Call placed to patient with message that assistance medications are ready for pick-up. Left messate for patient Paulo Fruit  BS,CPht II,MPH  December 21, 2009 11:30 AM

## 2010-05-26 NOTE — Progress Notes (Signed)
Summary: Prod. cough, green mucous, no fever x 3 days  Phone Note Call from Patient Call back at [facility] Phone [phone]   Caller: Patient Call For: Yisroel Ramming MD Reason for Call: Acute Illness Action Taken: Phone Call Completed, Appt Scheduled Details of Action Taken: 04/28/09 @ 1545 w/ Dr. Philipp Deputy Summary of Call: Prod. cough, green mucous x 3 days.  No fever.  Requesting appt.  Jennet Maduro RN  April 27, 2009 10:35 AM

## 2010-05-26 NOTE — Progress Notes (Signed)
Summary: [facility]/pt assist med arrived for Jun via [facility]  Phone Note Refill Request      Prescriptions: ATRIPLA 600-200-300 MG  TABS (EFAVIRENZ-EMTRICITAB-TENOFOVIR) Take 1 tablet by mouth at bedtime  #30 x 0   Entered by:   Paulo Fruit  BS,CPht II,MPH   Authorized by:   Yisroel Ramming MD   Signed by:   Paulo Fruit  BS,CPht II,MPH on 10/08/2009   Method used:   Samples Given   RxID:   [id number]  Patient Assist Medication Verification: Medication name: Atripla RX #  [id number] Tech approval:MLD Call placed to patient with message that assistance medications are ready for pick-up. patient said he will pick up next week. Paulo Fruit  BS,CPht II,MPH  October 08, 2009 11:02 AM

## 2010-05-26 NOTE — Progress Notes (Signed)
Summary: [facility]/pt assist med arrived for Mar  Phone Note Refill Request      Prescriptions: ATRIPLA 600-200-300 MG  TABS (EFAVIRENZ-EMTRICITAB-TENOFOVIR) Take 1 tablet by mouth at bedtime  #30 x 0   Entered by:   Paulo Fruit  BS,CPht II,MPH   Authorized by:   Yisroel Ramming MD   Signed by:   Paulo Fruit  BS,CPht II,MPH on 06/28/2009   Method used:   Samples Given   RxID:   [id number]   Patient Assist Medication Verification: Medication: Atripla Lot# [id number] Exp Date:07 2013 Tech approval:MLD Call placed to patient with message that assistance medications are ready for pick-up. Left message Paulo Fruit  BS,CPht II,MPH  June 28, 2009 2:51 PM

## 2010-05-26 NOTE — Progress Notes (Signed)
Summary: NCADAP/pt assist med arrived for Apr  Phone Note Refill Request      Prescriptions: ATRIPLA 600-200-300 MG  TABS (EFAVIRENZ-EMTRICITAB-TENOFOVIR) Take 1 tablet by mouth at bedtime  #30 x 0   Entered by:   Paulo Fruit  BS,CPht II,MPH   Authorized by:   Yisroel Ramming MD   Signed by:   Paulo Fruit  BS,CPht II,MPH on 08/04/2009   Method used:   Samples Given   RxID:   [id number]   Patient Assist Medication Verification: Medication: Atripla Lot# [id number] Exp Date:07 2013 Tech approval:MLD tried to contact patient. Was unable to leave message. Number on file not in service Paulo Fruit  BS,CPht II,MPH  August 04, 2009 3:35 PM                  Appended Document: NCADAP/pt assist med arrived for Apr Prescription/Samples picked up by: patient April's medications on  Aug 25, 2009

## 2010-05-26 NOTE — Progress Notes (Signed)
Summary: [employer]ADSAP/pt assist med arrived for May  Phone Note Refill Request      Prescriptions: ATRIPLA 600-200-300 MG  TABS (EFAVIRENZ-EMTRICITAB-TENOFOVIR) Take 1 tablet by mouth at bedtime  #30 x 0   Entered by:   Paulo Fruit  BS,CPht II,MPH   Authorized by:   Yisroel Ramming MD   Signed by:   Paulo Fruit  BS,CPht II,MPH on 08/27/2009   Method used:   Samples Given   RxID:   [id number]   Patient Assist Medication Verification: Medication: Atripla Lot# [id number] Exp Date:08 2013 Tech approval:MLD **Patient just picked up April's medications on Aug 25, 2009 Paulo Fruit  BS,CPht II,MPH  Aug 27, 2009 9:51 AM

## 2010-05-26 NOTE — Miscellaneous (Signed)
Summary: clinical update/ryan white NC[employer]AP app completed  Clinical Lists Changes  Observations: Added new observation of YEARLYEXPEN: 0  (07/09/2009 14:46) Added new observation of INCOMESOURCE: WAGES  (07/09/2009 14:46) Added new observation of PCTFPL: 131.47  (07/09/2009 14:46) Added new observation of HOUSEINCOME: [address]  (07/09/2009 14:46) Added new observation of FINASSESSDT: 07/08/2009  (07/09/2009 14:46)

## 2010-05-26 NOTE — Progress Notes (Signed)
Summary: NCADAP/pt assist med arrived for Jul  Phone Note Refill Request      Prescriptions: ATRIPLA 600-200-300 MG  TABS (EFAVIRENZ-EMTRICITAB-TENOFOVIR) Take 1 tablet by mouth at bedtime  #30 x 0   Entered by:   Paulo Fruit  BS,CPht II,MPH   Authorized by:   Yisroel Ramming MD   Signed by:   Paulo Fruit  BS,CPht II,MPH on 11/19/2009   Method used:   Samples Given   RxID:   [id number]  Patient Assist Medication Verification: Medication name: Atripla RX #  [id number] Tech approval:MLD Call placed to patient with message that assistance medications are ready for pick-up. Paulo Fruit  BS,CPht II,MPH  November 19, 2009 10:45 AM  Appended Document: NC[employer]/pt assist med arrived for Jul pt needs labs and f/u appt  Appended Document: [employer]/pt assist med arrived for Jul Lab and office visit scheduled

## 2010-05-26 NOTE — Progress Notes (Signed)
Summary: ncadap meds arrived for sept-pt aware  Phone Note Refill Request      Prescriptions: ATRIPLA 600-200-300 MG  TABS (EFAVIRENZ-EMTRICITAB-TENOFOVIR) Take 1 tablet by mouth at bedtime  #30 x 0   Entered by:   Paulo Fruit  BS,CPht II,MPH   Authorized by:   Yisroel Ramming MD   Signed by:   Paulo Fruit  BS,CPht II,MPH on 01/17/2010   Method used:   Samples Given   RxID:   [id number]  Patient Assist Medication Verification: Medication name: Neva Seat # [id number] Tech approval:mld Call placed to patient with message that assistance medications are ready for pick-up. Paulo Fruit  BS,CPht II,MPH  January 17, 2010 12:46 PM

## 2010-05-26 NOTE — Letter (Signed)
Summary: [facility]  [address]   [place], [place] [address]   Phone: [phone]  Fax: [phone]          April 19, 2010  Benjamin Buckley [address] [place], [place]  [address]  Dear [name],  It is also time for you to return to the Center at the above address to see your doctor for follow-up care and lab work.  Please call the number above and select option 2 to make your next appointments.  We have attempted to contact you over the past two months to come to pick up your medication(s)here at the Center.  We were unable to leave you messages to this effect.  Your medication is at the Center at the address listed above in the letterhead.  Please come in as soon as possible to pick up your medications and have your lab work drawn prior to your doctor appointment.  Wishing you a safe and healthy holiday season!  Sincerely,    Jennet Maduro [facility]

## 2010-06-14 ENCOUNTER — Encounter (INDEPENDENT_AMBULATORY_CARE_PROVIDER_SITE_OTHER): Payer: Self-pay | Admitting: *Deleted

## 2010-06-15 NOTE — Letter (Signed)
Summary: Imported By: Florinda Marker 06/06/2010 18:35:10   Imported By: Florinda Marker 06/06/2010 18:35:10  _____________________________________________________________________  External Attachment:    Type:   Image     Comment:   External Document

## 2010-06-21 NOTE — Miscellaneous (Signed)
  Clinical Lists Changes  Observations: Added new observation of HIV STATUS: HIV positive - not AIDS (06/14/2010 16:15)

## 2010-07-05 NOTE — Miscellaneous (Addendum)
Summary: Orders Update  Clinical Lists Changes  Orders: Added new Test order of T-Chlamydia  Probe, urine [phone]) - Signed Added new Test order of T-CBC w/Diff [phone]) - Signed Added new Test order of T-CD4SP [facility]) (CD4SP) - Signed Added new Test order of T-GC Probe, urine [phone]) - Signed Added new Test order of T-Comprehensive Metabolic Panel [phone]) - Signed Added new Test order of T-Hepatitis B Surface Antigen [phone]) - Signed Added new Test order of T-Lipid Profile ([id number]) - Signed Added new Test order of T-RPR (Syphilis) ([id number]) - Signed Added new Test order of T-Urinalysis ([id number]) - Signed Added new Test order of T-HIV Viral Load [phone]) - Signed     Process Orders Check Orders Results:     [facility]: ABN not required for this insurance Tests Sent for requisitioning (June 29, 2010 3:27 PM):     05/18/2010: [facility] -- [employer], urine [phone] (signed)     05/18/2010: [facility] -- T-CBC w/Diff [[id number]] (signed)     05/18/2010: [facility] -- T-GC Probe, urine [phone] (signed)     05/18/2010: [facility] -- T-Comprehensive Metabolic Panel [[id number]] (signed)     05/18/2010: [facility] -- T-Hepatitis B Surface Antigen [[id number]] (signed)     05/18/2010: [facility] -- T-Lipid Profile [phone] (signed)     05/18/2010: [facility] -- T-RPR (Syphilis) [phone] (signed)     05/18/2010: [facility] -- T-Urinalysis [[id number]] (signed)     05/18/2010: [facility] -- T-HIV Viral Load [phone] (signed)

## 2010-07-08 LAB — T-HELPER CELL (CD4) - (RCID CLINIC ONLY): CD4 % Helper T Cell: 28 % — ABNORMAL LOW (ref 33–55)

## 2010-07-20 ENCOUNTER — Other Ambulatory Visit: Payer: Self-pay | Admitting: *Deleted

## 2010-07-27 LAB — CROSSMATCH
ABO/RH(D): O POS
Antibody Screen: NEGATIVE

## 2010-07-27 LAB — CULTURE, BLOOD (ROUTINE X 2)
Culture: NO GROWTH
Culture: NO GROWTH

## 2010-07-27 LAB — BASIC METABOLIC PANEL
BUN: 8 mg/dL (ref 6–23)
CO2: 24 mEq/L (ref 19–32)
Calcium: 8.6 mg/dL (ref 8.4–10.5)
Chloride: 108 mEq/L (ref 96–112)
Creatinine, Ser: 0.88 mg/dL (ref 0.4–1.5)
GFR calc Af Amer: >60 mL/min (ref >60)
Glucose, Bld: 95 mg/dL (ref 70–99)
Potassium: 3.8 mEq/L (ref 3.5–5.1)
Sodium: 138 mEq/L (ref 135–145)
Sodium: 140 mEq/L (ref 135–145)

## 2010-07-27 LAB — CBC
HCT: 38.2 % — ABNORMAL LOW (ref 39.0–52.0)
HCT: 40 % (ref 39.0–52.0)
HCT: 41.2 % (ref 39.0–52.0)
Hemoglobin: 13.4 g/dL (ref 13.0–17.0)
Hemoglobin: 13.8 g/dL (ref 13.0–17.0)
MCHC: 33.5 g/dL (ref 30.0–36.0)
MCHC: 33.7 g/dL (ref 30.0–36.0)
MCHC: 33.7 g/dL (ref 30.0–36.0)
MCV: 90.2 fL (ref 78.0–100.0)
MCV: 90.8 fL (ref 78.0–100.0)
MCV: 91 fL (ref 78.0–100.0)
Platelets: 167 10*3/uL (ref 150–400)
Platelets: 178 10*3/uL (ref 150–400)
Platelets: 180 10*3/uL (ref 150–400)
RBC: 4.39 MIL/uL (ref 4.22–5.81)
RBC: 4.6 MIL/uL (ref 4.22–5.81)
RDW: 13.6 % (ref 11.5–15.5)
RDW: 13.7 % (ref 11.5–15.5)
RDW: 13.8 % (ref 11.5–15.5)
WBC: 10.1 10*3/uL (ref 4.0–10.5)
WBC: 7.3 10*3/uL (ref 4.0–10.5)

## 2010-07-27 LAB — URINALYSIS, ROUTINE W REFLEX MICROSCOPIC
Glucose, UA: 500 mg/dL — AB
Ketones, ur: NEGATIVE mg/dL
Nitrite: NEGATIVE
Specific Gravity, Urine: 1.017 (ref 1.005–1.030)
pH: 5.5 (ref 5.0–8.0)
pH: 5.5 (ref 5.0–8.0)

## 2010-07-27 LAB — ABO/RH: ABO/RH(D): O POS

## 2010-07-27 LAB — URINE CULTURE: Colony Count: NO GROWTH

## 2010-07-27 LAB — COMPREHENSIVE METABOLIC PANEL
ALT: 26 U/L (ref 0–53)
Albumin: 3.7 g/dL (ref 3.5–5.2)
Alkaline Phosphatase: 98 U/L (ref 39–117)
BUN: 15 mg/dL (ref 6–23)
Calcium: 9 mg/dL (ref 8.4–10.5)
Potassium: 4 mEq/L (ref 3.5–5.1)
Sodium: 135 mEq/L (ref 135–145)
Total Protein: 7.4 g/dL (ref 6.0–8.3)

## 2010-07-27 LAB — DIFFERENTIAL
Basophils Relative: 0 % (ref 0–1)
Lymphs Abs: 0.7 10*3/uL (ref 0.7–4.0)
Monocytes Absolute: 1 10*3/uL (ref 0.1–1.0)
Monocytes Relative: 7 % (ref 3–12)
Neutro Abs: 13.4 10*3/uL — ABNORMAL HIGH (ref 1.7–7.7)
Neutrophils Relative %: 89 % — ABNORMAL HIGH (ref 43–77)

## 2010-07-27 LAB — HEMOCCULT GUIAC POC 1CARD (OFFICE): Fecal Occult Bld: POSITIVE

## 2010-07-27 LAB — GLUCOSE, CAPILLARY: Glucose-Capillary: 96 mg/dL (ref 70–99)

## 2010-07-27 LAB — LEGIONELLA ANTIGEN, URINE: Legionella Antigen, Urine: NEGATIVE

## 2010-07-27 LAB — RAPID STREP SCREEN (MED CTR MEBANE ONLY): Streptococcus, Group A Screen (Direct): NEGATIVE

## 2010-07-27 LAB — PROTIME-INR
INR: 1.07 (ref 0.00–1.49)
Prothrombin Time: 13.8 seconds (ref 11.6–15.2)

## 2010-07-27 LAB — RAPID URINE DRUG SCREEN, HOSP PERFORMED
Amphetamines: NOT DETECTED
Barbiturates: NOT DETECTED
Opiates: NOT DETECTED

## 2010-08-01 LAB — T-HELPER CELL (CD4) - (RCID CLINIC ONLY): CD4 % Helper T Cell: 24 % — ABNORMAL LOW (ref 33–55)

## 2010-08-09 LAB — DIFFERENTIAL
Eosinophils Relative: 0 % (ref 0–5)
Lymphocytes Relative: 16 % (ref 12–46)
Lymphs Abs: 1.9 10*3/uL (ref 0.7–4.0)
Monocytes Absolute: 1 10*3/uL (ref 0.1–1.0)

## 2010-08-09 LAB — BASIC METABOLIC PANEL
BUN: 7 mg/dL (ref 6–23)
CO2: 26 mEq/L (ref 19–32)
Chloride: 106 mEq/L (ref 96–112)
Chloride: 108 mEq/L (ref 96–112)
Creatinine, Ser: 0.84 mg/dL (ref 0.4–1.5)
GFR calc Af Amer: >60 mL/min (ref >60)
Glucose, Bld: 93 mg/dL (ref 70–99)
Glucose, Bld: 95 mg/dL (ref 70–99)
Potassium: 3.7 mEq/L (ref 3.5–5.1)
Sodium: 140 mEq/L (ref 135–145)

## 2010-08-09 LAB — CBC
HCT: 39.6 % (ref 39.0–52.0)
Hemoglobin: 13.3 g/dL (ref 13.0–17.0)
MCHC: 33.4 g/dL (ref 30.0–36.0)
MCHC: 33.6 g/dL (ref 30.0–36.0)
MCV: 92.1 fL (ref 78.0–100.0)
MCV: 93 fL (ref 78.0–100.0)
Platelets: 175 10*3/uL (ref 150–400)
Platelets: 182 10*3/uL (ref 150–400)
RBC: 4.29 MIL/uL (ref 4.22–5.81)
RDW: 13.8 % (ref 11.5–15.5)
RDW: 14.1 % (ref 11.5–15.5)
RDW: 14.1 % (ref 11.5–15.5)
WBC: 10.2 10*3/uL (ref 4.0–10.5)

## 2010-08-09 LAB — COMPREHENSIVE METABOLIC PANEL
AST: 22 U/L (ref 0–37)
Albumin: 3.3 g/dL — ABNORMAL LOW (ref 3.5–5.2)
Calcium: 8.8 mg/dL (ref 8.4–10.5)
Creatinine, Ser: 0.69 mg/dL (ref 0.4–1.5)
GFR calc Af Amer: >60 mL/min (ref >60)

## 2010-08-09 LAB — LEGIONELLA ANTIGEN, URINE

## 2010-08-09 LAB — CULTURE, BLOOD (ROUTINE X 2)
Culture: NO GROWTH
Culture: NO GROWTH

## 2010-08-09 LAB — EXPECTORATED SPUTUM ASSESSMENT W GRAM STAIN, RFLX TO RESP C

## 2010-08-09 LAB — T-HELPER CELLS (CD4) COUNT (NOT AT ARMC): CD4 T Cell Abs: 580 uL (ref 400–2700)

## 2010-08-09 LAB — HIV-1 RNA QUANT-NO REFLEX-BLD: HIV 1 RNA Quant: <48 copies/mL (ref <48)

## 2010-08-09 LAB — URINALYSIS, ROUTINE W REFLEX MICROSCOPIC
Leukocytes, UA: NEGATIVE
Nitrite: NEGATIVE
Specific Gravity, Urine: 1.034 — ABNORMAL HIGH (ref 1.005–1.030)
pH: 6 (ref 5.0–8.0)

## 2010-08-09 LAB — URINE MICROSCOPIC-ADD ON

## 2010-08-09 LAB — STREP PNEUMONIAE URINARY ANTIGEN: Strep Pneumo Urinary Antigen: NEGATIVE

## 2010-09-06 NOTE — Discharge Summary (Signed)
NAMEFREDIE, Benjamin Buckley               ACCOUNT NO.:  [id number]   MEDICAL RECORD NO.:  [id number]          PATIENT TYPE:  INP   LOCATION:  5156                         FACILITY:  [facility]   PHYSICIAN:  Eliseo Gum, M.D.   DATE OF BIRTH:  May 21, 1965   DATE OF ADMISSION:  05/02/2007  DATE OF DISCHARGE:  05/04/2007                               DISCHARGE SUMMARY   DISCHARGE DIAGNOSES:  1. HIV, not currently on ART therapy. CD4 count in 2006 was 220,      current CD4 count 210.  2. Headache and fever secondary to sinus infection.  3. History of syphilis.  4. History of appendicitis.  5. History of HPV.  6. History of hepatitis B virus core antigen positive, vaccinated.   DISCHARGE MEDICATIONS:  1. Ceftin 500 mg p.o. b.i.d.  2. Tylenol p.r.n. for pain.   DISPOSITION AND FOLLOWUP:  Mr. Miskell was discharged in stable and  improved condition. He has a followup appointment in the outpatient  clinic, infectious disease clinic in 1-2 weeks. The recommendation in  addition to antibiotic therapy for his sinus infection is increase fluid  intake, saline nasal flushes and symptomatic treatment with  decongestants and pain relievers.   He is to complete 10 days of antibiotics by mouth.   BRIEF HISTORY AND PHYSICAL:  ADMISSION VITAL SIGNS:  Temperature 101.2,  blood pressure 135/87, pulse 94, respiratory rate 14.   ADMISSION LABORATORIES:  Sodium 131, potassium 3.9, chloride 100, bicarb  23, BUN 13, creatinine 0.82, glucose 143. WBC 6.7, hemoglobin 14.2, MCV  86, ANC 4.5. CD4 210, viral load 3250. Normal liver function tests.   Chest x-ray no acute disease.   UA negative, sputum cultures negative. Blood cultures negative.  Cryptococcal antigen negative, syphilis titer 1 to 128.   Mr. Fecteau is a 45 year old, African-American man with a past medical  history of HIV diagnosed for 12 years, syphilis and history of HPV who  presents to the emergency department complaining of fever and  headache.  He in addition complained of facial pain, lateral neck pain, decreased  appetite and lethargy for the past 2 weeks. He was seen at [facility] emergency room for a fever of 101 and headache where he was  given amoxicillin. He returned 4 days later to the [facility] emergency  department with no relief. Temperature at the time of being seen in the  emergency department was 100.8. Chest x-ray did not show any definite  pneumonia and the patient was not in any respiratory distress. His  symptoms were mostly consistent with upper nasal congestion, headache,  sneezing, coughing, muscle aches and pains. He was seen the day of  admission in the infectious disease clinic where he had a positive  syphilis titer despite being treated for this in the past. He was  admitted to rule out an infectious process given his HIV.   CONSULTATIONS:  None.   PROCEDURE:  Lumbar puncture was normal and performed without any  complications.   HOSPITAL COURSE BY PROBLEM:  Fever with headache:  Given Mr. Kocsis's  history of HIV with an unknown  CD4 count at the time of admission, he  was admitted for an infectious disease workup causing his symptoms with  a large differential including meningitis, pneumonia and bacteremia. In  general, his workup was essentially negative except for respiratory  cultures growing Moraxella Cataralis which was beta-lactamases positive  and resistant to amoxicillin. It is felt that he was treated with  amoxicillin which would not cover the sinus infection that he had. He  was started on Ceftin, his fever defervesced. Once he had gotten on  treatment of IV Rocephin, he began to feel immediately better. His  headache resolved. His appetite improved and his energy level was close  to his baseline at the time of discharge. As part of a complete workup,  Mr. Ohms had a negative workup for neurosyphilis. CSF cultures were  negative. Culture data included negative  cryptococcal antigen, CSF  analysis was negative for any viral pathology including HSV,  Cryptococcus, neurosyphilis. Blood cultures were negative for any  growth. CT/MRI of his head was obtained which did not demonstrate any  abnormal lesions. This included a rule out for toxo or lymphoma lesions.  A PPD was also placed which was negative.   DISCHARGE VITAL SIGNS:  Temperature 99.5, heart rate 72, respirations  18, blood pressure 127/75. O2 sats 96% on room air.      Edsel Petrin, D.O.  Electronically Signed      Eliseo Gum, M.D.  Electronically Signed    ELG/MEDQ  D:  05/23/2007  T:  05/23/2007  Job:  [id number]

## 2010-09-06 NOTE — Discharge Summary (Signed)
NAMEDECLYN, Benjamin Buckley NO.:  [id number]   MEDICAL RECORD NO.:  [id number]          PATIENT TYPE:  INP   LOCATION:  5015                         FACILITY:  [facility]   PHYSICIAN:  Acey Lav, MD  DATE OF BIRTH:  [birth year]   DATE OF ADMISSION:  06/15/2008  DATE OF DISCHARGE:  06/17/2008                               DISCHARGE SUMMARY   DISCHARGE DIAGNOSES:  1. Pneumonia - bilateral lower lobes, resolved on discharge; the      patient discharged on Avelox.  2. Nausea, vomiting, abdominal pain - unclear etiology, resolved on      discharge.  3. Flank pain - likely musculoskeletal in origin, resolved on      discharge.  4. HIV (human immunodeficiency virus) stable.  CD-28 May 2008 580.  5. History of syphilis in 1996.  6. History of HPV (human papillomavirus).  7. History of toxo in 1996 and CMV (cytomegalovirus) in 1999.  8. Hepatitis B diagnosed in 1996.  9. Allergic rhinitis.   MEDICATIONS ON DISCHARGE:  1. Atripla take 1 tablet at bedtime.  2. Avelox 400 mg tablet take 1 tablet once daily for 4 more days.  3. Claritin 10 mg tablet take once daily.  4. Flonase 50 mcg take 1-2 sprays once daily into each nostril.   DISPOSITION AND FOLLOWUP:  The patient was discharged from the unit in  stable condition.  No fever, chills.  No nausea or vomiting.  No  abdominal pain, headaches or flank pain.  Able to tolerate solids and  liquids.  He will follow up in outpatient clinic with Dr. Sherryll Burger on July 02, 2008.  On followup appointment please check BMET since the patient  had low potassium on admission.  CBC was stable throughout the  hospitalization so no need to check CBC.   PROCEDURES:  February 22, chest x-ray showed patchy infiltrate in right  lower and middle lobe.  No infusion.  No cardiomegaly.  February 23, CT  of the chest showed mild patchy infiltrate in the left lower lobe and  right lower lobe.  CT of the abdomen and pelvis negative for acute  findings.  February 23, MRI of the lumbar spine no acute findings.  No  disk degeneration.  No spinal stenosis or disk protrusion.   CONSULTATIONS:  None.   HISTORY OF PRESENT ILLNESS:  The patient is a 45 year old male with HIV  diagnosed in 1995 who presents with concern of vomiting, nonbloody,  several episodes that started 2 days prior to admission associated with  fever, headaches, abdominal pain and flank pain.  The patient reports  being in his usual state of health.  Denies any sick contacts at home or  work.  No recent traveling.  No unusual food intake.  Vomiting started  first without apparent reason and he experienced generalized abdominal  tenderness.  No diarrhea or hematochezia.  No urinary concerns.  Regular  bowel movements.  No weakness.  The patient does report headaches and  flank pain that have been bothering him for the past 2 weeks that occur  at random with  no aggravating or alleviating factors.  The patient takes  all the meds regularly and reports being active at work and home.   VITAL SIGNS:  Temperature 100.6, blood pressure 151/99, pulse 80,  respirations 18, saturation 98% on room air.  GENERAL:  On physical exam the patient was lying in bed and not in acute  distress.  HEENT:  Muddy sclerae.  EOMI, PERRLA, 4 mm diameter.  ENT no thrush.  Clear oropharynx.  Moist mucous membranes.  NECK:  Neck supple.  No lymphadenopathy.  No stiffness.  LUNGS:  Respirations clear to auscultation bilaterally.  No wheezing, no  rales, no rhonchi.  CARDIOVASCULAR:  System regular rate and rhythm.  Systolic ejection  murmur 2/6.  S1-S2 present.  No JVD.  ABDOMEN:  Abdomen soft, mild epigastric tenderness.  Bowel sounds  present.  No rebound tenderness.  Extremities no edema.  No cyanosis, +2  pulses bilaterally.  SKIN:  Skin normal skin turgor.  No rashes.  LYMPHS:  No palpable lymphadenopathy.  NEUROLOGICAL:  Alert and oriented x3.  Cranial nerves II-XII intact.  Motor  strength 5/5.  Sensation intact to soft touch bilaterally.  PSYCHIATRIC:  Not anxious or depressed appearing.   LABS:  Sodium 133, potassium 3.3, chloride 106, HCO3 26, BUN 7,  creatinine 0.84, glucose 95, WBC 10.2, hemoglobin 14.6, PLT 175, albumin  3.3, calcium 8.9, magnesium 2.0.   HOSPITAL COURSE:  1. Pneumonia - likely community acquired pneumonia.  The patient      placed on droplet precaution and started on Avelox.  Chest x-ray      and CT of the chest showed bilateral lower lobe infiltrates (mild).      The patient has clinically improved by next day, remained afebrile      throughout the hospitalization, maintained saturation above 96% on      room air, was able to tolerate solids and liquids.  Given his HIV      positive status rechecked CD-4 count.  Count in November 2009 of      300 and on this admission 580.  The patient was discharged in      stable condition and will continue to take Avelox for 4 more days.      He will follow up in outpatient clinic.  2. Nausea, vomiting and abdominal pain - unclear etiology, likely      viral in nature.  The patient was hydrated with fluids, potassium      levels repleted.  On discharge potassium within normal limits.  The      patient had no vomiting and was able to tolerate solids and      liquids.  CT of the abdomen clear as well as CT of the pelvis.  3. Flank pain.  Urinalysis negative.  Urine cultures pending on      discharge.  MRI of the spine negative for disk degeneration or      spinal stenosis.  No acute findings.  The patient will follow up in      outpatient clinic.  On discharge he denied pain.  4. HIV.  CD-4 count increased from 300 to 500.  The patient will      continue Atripla.   DISCHARGE VITAL SIGNS AND LABS:  Sodium 140, potassium 3.7, chloride  108, HCO3 26, BUN 6, creatinine 0.77, glucose 93, WBC 6.2, hemoglobin  13.3, platelets 188.  TSH 1.643.  Vitals, temperature 97.1, blood  pressure 118/66, pulse 63,  respirations 20, saturating 100% on  room air.   Over 30 minutes spent on discharge.     Mliss Sax, MD  Electronically Signed      Acey Lav, MD  Electronically Signed   IM/MEDQ  D:  06/17/2008  T:  06/17/2008  Job:  [id number]

## 2011-01-12 LAB — CRYPTOCOCCAL ANTIGEN, CSF: Crypto Ag: NEGATIVE

## 2011-01-12 LAB — FLUORESCENT TREPONEMAL AB(FTA)-IGG-BLD

## 2011-01-12 LAB — CBC
Hemoglobin: 12.9 — ABNORMAL LOW
MCHC: 33.2
MCHC: 34.1
MCV: 86.8
Platelets: 217
Platelets: 241
Platelets: 248
RBC: 4.72
RBC: 4.94
RDW: 12.9
WBC: 6.8

## 2011-01-12 LAB — CSF CULTURE W GRAM STAIN: Culture: NO GROWTH

## 2011-01-12 LAB — CSF CELL COUNT WITH DIFFERENTIAL
Eosinophils, CSF: 0
Lymphs, CSF: 98 — ABNORMAL HIGH
Segmented Neutrophils-CSF: 1
Tube #: 4

## 2011-01-12 LAB — P CARINII SMEAR DFA: Pneumocystis carinii DFA: NEGATIVE

## 2011-01-12 LAB — URINE CULTURE
Colony Count: NO GROWTH
Culture: NO GROWTH

## 2011-01-12 LAB — URINALYSIS, MICROSCOPIC ONLY
Bilirubin Urine: NEGATIVE
Ketones, ur: NEGATIVE
Leukocytes, UA: NEGATIVE
Nitrite: NEGATIVE
Protein, ur: NEGATIVE
Urobilinogen, UA: 1
pH: 5.5

## 2011-01-12 LAB — DIFFERENTIAL
Basophils Absolute: 0
Basophils Relative: 0
Basophils Relative: 0
Basophils Relative: 1
Eosinophils Absolute: 0
Eosinophils Relative: 0
Lymphs Abs: 1.4
Lymphs Abs: 1.8
Monocytes Absolute: 0.5
Monocytes Relative: 10
Monocytes Relative: 8
Monocytes Relative: 8
Neutro Abs: 4.2
Neutro Abs: 4.5
Neutrophils Relative %: 62
Neutrophils Relative %: 68
Neutrophils Relative %: 69

## 2011-01-12 LAB — EXPECTORATED SPUTUM ASSESSMENT W GRAM STAIN, RFLX TO RESP C

## 2011-01-12 LAB — CULTURE, BLOOD (ROUTINE X 2): Culture: NO GROWTH

## 2011-01-12 LAB — HSV PCR: HSV, PCR: NOT DETECTED

## 2011-01-12 LAB — COMPREHENSIVE METABOLIC PANEL
ALT: 38
AST: 31
AST: 31
Albumin: 3 — ABNORMAL LOW
Alkaline Phosphatase: 82
BUN: 13
CO2: 23
Calcium: 8.8
Calcium: 8.8
Creatinine, Ser: 0.82
GFR calc Af Amer: >60
GFR calc Af Amer: >60
GFR calc non Af Amer: >60
Potassium: 3.9
Sodium: 135
Total Protein: 7.2

## 2011-01-12 LAB — GC/CHLAMYDIA PROBE AMP, URINE
Chlamydia, Swab/Urine, PCR: NEGATIVE
GC Probe Amp, Urine: NEGATIVE

## 2011-01-12 LAB — TOXOPLASMA GONDII ANTIBODY, IGM: Toxoplasma Antibody- IgM: <0.1 IV

## 2011-01-12 LAB — CRYPTOCOCCAL ANTIGEN

## 2011-01-12 LAB — PROTEIN AND GLUCOSE, CSF: Total  Protein, CSF: 62 — ABNORMAL HIGH

## 2011-01-25 LAB — T-HELPER CELL (CD4) - (RCID CLINIC ONLY): CD4 % Helper T Cell: 19 — ABNORMAL LOW

## 2015-07-28 ENCOUNTER — Other Ambulatory Visit: Payer: Self-pay

## 2015-07-28 NOTE — Telephone Encounter (Deleted)
[facility] Atenolol/chlorthalide 100-25 mg tab #90   Take 1/2 tablet by mouth every morning.
